# Patient Record
Sex: Male | Born: 1955 | Hispanic: Yes | Smoking: Never smoker
Health system: Southern US, Community
[De-identification: ages and names within clinical notes are randomized; demographics above are authoritative.]

## PROBLEM LIST (undated history)

## (undated) ENCOUNTER — Emergency Department (HOSPITAL_COMMUNITY): Payer: Self-pay | Source: Home / Self Care

---

## 2016-04-28 ENCOUNTER — Emergency Department (HOSPITAL_COMMUNITY): Payer: Self-pay

## 2016-04-28 ENCOUNTER — Emergency Department (HOSPITAL_COMMUNITY)
Admission: EM | Admit: 2016-04-28 | Discharge: 2016-04-28 | Disposition: A | Discharge status: Home / Self Care | Payer: Self-pay | Attending: Emergency Medicine | Admitting: Emergency Medicine

## 2016-04-28 ENCOUNTER — Encounter (HOSPITAL_COMMUNITY): Payer: Self-pay | Admitting: *Deleted

## 2016-04-28 DIAGNOSIS — K5900 Constipation, unspecified: Secondary | ICD-10-CM | POA: Insufficient documentation

## 2016-04-28 DIAGNOSIS — R1084 Generalized abdominal pain: Secondary | ICD-10-CM

## 2016-04-28 LAB — CBC
HEMATOCRIT: 45.6 % (ref 39.0–52.0)
Hemoglobin: 15.8 g/dL (ref 13.0–17.0)
MCH: 31.9 pg (ref 26.0–34.0)
MCHC: 34.6 g/dL (ref 30.0–36.0)
MCV: 92.1 fL (ref 78.0–100.0)
Platelets: 270 10*3/uL (ref 150–400)
RBC: 4.95 MIL/uL (ref 4.22–5.81)
RDW: 13.6 % (ref 11.5–15.5)
WBC: 7.7 10*3/uL (ref 4.0–10.5)

## 2016-04-28 LAB — COMPREHENSIVE METABOLIC PANEL
ALK PHOS: 78 U/L (ref 38–126)
ALT: 21 U/L (ref 17–63)
AST: 20 U/L (ref 15–41)
Albumin: 4.1 g/dL (ref 3.5–5.0)
Anion gap: 8 (ref 5–15)
BUN: 17 mg/dL (ref 6–20)
CHLORIDE: 106 mmol/L (ref 101–111)
CO2: 23 mmol/L (ref 22–32)
Calcium: 8.6 mg/dL — ABNORMAL LOW (ref 8.9–10.3)
Creatinine, Ser: 1.04 mg/dL (ref 0.61–1.24)
GFR calc Af Amer: >60 mL/min (ref >60)
GLUCOSE: 120 mg/dL — AB (ref 65–99)
POTASSIUM: 3.7 mmol/L (ref 3.5–5.1)
SODIUM: 137 mmol/L (ref 135–145)
TOTAL PROTEIN: 6.9 g/dL (ref 6.5–8.1)
Total Bilirubin: 0.7 mg/dL (ref 0.3–1.2)

## 2016-04-28 LAB — URINALYSIS, ROUTINE W REFLEX MICROSCOPIC
BILIRUBIN URINE: NEGATIVE
GLUCOSE, UA: NEGATIVE mg/dL
HGB URINE DIPSTICK: NEGATIVE
KETONES UR: NEGATIVE mg/dL
Leukocytes, UA: NEGATIVE
Nitrite: NEGATIVE
PH: 6 (ref 5.0–8.0)
Protein, ur: NEGATIVE mg/dL
Specific Gravity, Urine: 1.025 (ref 1.005–1.030)

## 2016-04-28 LAB — LIPASE, BLOOD: LIPASE: 17 U/L (ref 11–51)

## 2016-04-28 MED ORDER — DIATRIZOATE MEGLUMINE & SODIUM 66-10 % PO SOLN
ORAL | Status: AC
  Filled 2016-04-28: qty 30

## 2016-04-28 MED ORDER — IOPAMIDOL (ISOVUE-300) INJECTION 61%
100.0000 mL | Freq: Once | INTRAVENOUS | Status: AC | PRN
  Administered 2016-04-28: 100 mL via INTRAVENOUS

## 2016-04-28 MED ORDER — ONDANSETRON HCL 4 MG/2ML IJ SOLN
4.0000 mg | Freq: Once | INTRAMUSCULAR | Status: AC
  Administered 2016-04-28: 4 mg via INTRAVENOUS
  Filled 2016-04-28: qty 2

## 2016-04-28 MED ORDER — FENTANYL CITRATE (PF) 100 MCG/2ML IJ SOLN
50.0000 ug | Freq: Once | INTRAMUSCULAR | Status: AC
Start: 2016-04-28 — End: 2016-04-28
  Administered 2016-04-28: 50 ug via INTRAVENOUS
  Filled 2016-04-28: qty 2

## 2016-04-28 MED ORDER — SODIUM CHLORIDE 0.9 % IV BOLUS (SEPSIS)
1000.0000 mL | Freq: Once | INTRAVENOUS | Status: AC
  Administered 2016-04-28: 1000 mL via INTRAVENOUS

## 2016-04-28 NOTE — ED Provider Notes (Signed)
[facility] DEPT Provider Note   CSN: [id number] Arrival date & time: 04/28/16  0458  Time Seen 06:20 AM   History   Chief Complaint Chief Complaint  Patient presents with  . Abdominal Pain    HPI Adlai Sinning is a 60 y.o. male.  HPI patient states he woke up at 1 AM with 2 spots on his abdomen that are painful. He has had nausea without vomiting, diarrhea or fever.  He has never had this before. He also c/o pain in his bilateral flanks.    PCP None  History reviewed. No pertinent past medical history.  There are no active problems to display for this patient.   History reviewed. No pertinent surgical history.     Home Medications    Prior to Admission medications   Not on File    Family History History reviewed. No pertinent family history.  Social History Social History  Substance Use Topics  . Smoking status: Never Smoker  . Smokeless tobacco: Never Used  . Alcohol use No  employed Drinks beer   Allergies   Review of patient's allergies indicates no known allergies.   Review of Systems Review of Systems  All other systems reviewed and are negative.    Physical Exam Updated Vital Signs BP (!) 158/111 (BP Location: Left Arm)   Pulse (!) 55   Temp 97.5 F (36.4 C) (Oral)   Resp 18   Ht 5\' 2"  (1.575 m)   Wt 162 lb (73.5 kg)   SpO2 97%   BMI 29.63 kg/m   Vital signs normal except hypertension   Physical Exam  Constitutional: He is oriented to person, place, and time. He appears well-developed and well-nourished.  Non-toxic appearance. He does not appear ill. No distress.  HENT:  Head: Normocephalic and atraumatic.  Right Ear: External ear normal.  Left Ear: External ear normal.  Nose: Nose normal. No mucosal edema or rhinorrhea.  Mouth/Throat: Oropharynx is clear and moist and mucous membranes are normal. No dental abscesses or uvula swelling.  Eyes: Conjunctivae and EOM are normal. Pupils are equal, round, and reactive to  light.  Neck: Normal range of motion and full passive range of motion without pain. Neck supple.  Cardiovascular: Normal rate, regular rhythm and normal heart sounds.  Exam reveals no gallop and no friction rub.   No murmur heard. Pulmonary/Chest: Effort normal and breath sounds normal. No respiratory distress. He has no wheezes. He has no rhonchi. He has no rales. He exhibits no tenderness and no crepitus.  Abdominal: Soft. Normal appearance and bowel sounds are normal. He exhibits no distension. There is generalized tenderness. There is no rebound and no guarding.  Musculoskeletal: Normal range of motion. He exhibits no edema or tenderness.  Moves all extremities well.   Neurological: He is alert and oriented to person, place, and time. He has normal strength. No cranial nerve deficit.  Skin: Skin is warm, dry and intact. No rash noted. No erythema. No pallor.  Psychiatric: He has a normal mood and affect. His speech is normal and behavior is normal. His mood appears not anxious.  Nursing note and vitals reviewed.    ED Treatments / Results  Labs (all labs ordered are listed, but only abnormal results are displayed) Results for orders placed or performed during the hospital encounter of 04/28/16  Lipase, blood  Result Value Ref Range   Lipase 17 11 - 51 U/L  Comprehensive metabolic panel  Result Value Ref Range   Sodium 137  135 - 145 mmol/L   Potassium 3.7 3.5 - 5.1 mmol/L   Chloride 106 101 - 111 mmol/L   CO2 23 22 - 32 mmol/L   Glucose, Bld 120 (H) 65 - 99 mg/dL   BUN 17 6 - 20 mg/dL   Creatinine, Ser 4.781.04 0.61 - 1.24 mg/dL   Calcium 8.6 (L) 8.9 - 10.3 mg/dL   Total Protein 6.9 6.5 - 8.1 g/dL   Albumin 4.1 3.5 - 5.0 g/dL   AST 20 15 - 41 U/L   ALT 21 17 - 63 U/L   Alkaline Phosphatase 78 38 - 126 U/L   Total Bilirubin 0.7 0.3 - 1.2 mg/dL   GFR calc non Af Amer >60 >60 mL/min   GFR calc Af Amer >60 >60 mL/min   Anion gap 8 5 - 15  CBC  Result Value Ref Range   WBC 7.7  4.0 - 10.5 K/uL   RBC 4.95 4.22 - 5.81 MIL/uL   Hemoglobin 15.8 13.0 - 17.0 g/dL   HCT 29.545.6 62.139.0 - 30.852.0 %   MCV 92.1 78.0 - 100.0 fL   MCH 31.9 26.0 - 34.0 pg   MCHC 34.6 30.0 - 36.0 g/dL   RDW 65.713.6 84.611.5 - 96.215.5 %   Platelets 270 150 - 400 K/uL  Urinalysis, Routine w reflex microscopic  Result Value Ref Range   Color, Urine YELLOW YELLOW   APPearance CLEAR CLEAR   Specific Gravity, Urine 1.025 1.005 - 1.030   pH 6.0 5.0 - 8.0   Glucose, UA NEGATIVE NEGATIVE mg/dL   Hgb urine dipstick NEGATIVE NEGATIVE   Bilirubin Urine NEGATIVE NEGATIVE   Ketones, ur NEGATIVE NEGATIVE mg/dL   Protein, ur NEGATIVE NEGATIVE mg/dL   Nitrite NEGATIVE NEGATIVE   Leukocytes, UA NEGATIVE NEGATIVE   Laboratory interpretation all normal     EKG  EKG Interpretation None       Radiology Ct Abdomen Pelvis W Contrast  Result Date: 04/28/2016 CLINICAL DATA:  Generalized abdominal pain radiating into the back for several days. EXAM: CT ABDOMEN AND PELVIS WITH CONTRAST TECHNIQUE: Multidetector CT imaging of the abdomen and pelvis was performed using the standard protocol following bolus administration of intravenous contrast. CONTRAST:  100 ml ISOVUE-300 IOPAMIDOL (ISOVUE-300) INJECTION 61% COMPARISON:  None. FINDINGS: Dependent atelectasis is seen lung bases. No pleural or pericardial effusion. A small amount of oral contrast is seen in the distal esophagus suggestive of either poor motility or gastroesophageal reflux. The gallbladder, liver, spleen, adrenal glands, kidneys and pancreas are unremarkable. The prostate gland is somewhat prominent. Urinary bladder and seminal vesicles are unremarkable. The stomach, small and large bowel and appendix appear normal. There is no lymphadenopathy or fluid. Convex right lumbar scoliosis is identified. Small bone island in the right femoral neck is seen. Osseous structures are otherwise unremarkable. Small calcification in the right gluteus maximus may be an injection  granuloma. IMPRESSION: No acute abnormality or finding to explain the patient's symptoms. Mild prostatomegaly. Small amount oral contrast and distal esophagus could be due to esophageal dysmotility or gastroesophageal reflux. Electronically Signed   By: Drusilla Kannerhomas  Dalessio M.D.   On: 04/28/2016 07:13    Procedures Procedures (including critical care time)  Medications Ordered in ED Medications  diatrizoate meglumine-sodium (GASTROGRAFIN) 66-10 % solution (not administered)  fentaNYL (SUBLIMAZE) injection 50 mcg (50 mcg Intravenous Given 04/28/16 0635)  ondansetron (ZOFRAN) injection 4 mg (4 mg Intravenous Given 04/28/16 0635)  sodium chloride 0.9 % bolus 1,000 mL (0 mLs Intravenous Stopped 04/28/16 0800)  iopamidol (ISOVUE-300) 61 % injection 100 mL (100 mLs Intravenous Contrast Given 04/28/16 0648)     Initial Impression / Assessment and Plan / ED Course  I have reviewed the triage vital signs and the nursing notes.  Pertinent labs & imaging results that were available during my care of the patient were reviewed by me and considered in my medical decision making (see chart for details).  Clinical Course   Pt was given IV fluids, IV pain and nausea medications. We discussed getting a CT scan of his abdomen/pelvis and he is agreeable.   I looked at patient CT scan he has a lot of stool in his right colon. I am going to treat him for constipation. Is given his test results.  Final Clinical Impressions(s) / ED Diagnoses   Final diagnoses:  Generalized abdominal pain  Constipation, unspecified constipation type    New Prescriptions OTC miralax  Plan discharge  Devoria AlbeIva Dyneisha Murchison, MD, [facility] PyoFACEP    Leafy Motsinger, MD 04/28/16 [phone]

## 2016-04-28 NOTE — Discharge Instructions (Signed)
Get miralax and put one dose or 17 g in 8 ounces of water,  take 1 dose every 30 minutes for 2-3 hours or until you  get good results and then once or twice daily to prevent constipation.   Obtenga MiraLAX y poner una dosis o 17 g en 240 ml (8 onzas) de agua, tome 1 dosis cada 30 minutos durante 2-3 horas o hasta que [place]obtenga buenos resultados y, a Radio producercontinuacin, una vez o dos veces al da para Multimedia programmerevitar el estreimiento

## 2016-04-28 NOTE — ED Triage Notes (Signed)
Pt c/o generalized abdominal pain that radiates around to bilateral back;

## 2016-06-14 ENCOUNTER — Encounter (HOSPITAL_COMMUNITY): Payer: Self-pay | Admitting: *Deleted

## 2016-06-14 ENCOUNTER — Emergency Department (HOSPITAL_COMMUNITY)
Admission: EM | Admit: 2016-06-14 | Discharge: 2016-06-14 | Disposition: A | Discharge status: Home / Self Care | Payer: Self-pay | Attending: Emergency Medicine | Admitting: Emergency Medicine

## 2016-06-14 DIAGNOSIS — K2900 Acute gastritis without bleeding: Secondary | ICD-10-CM | POA: Insufficient documentation

## 2016-06-14 DIAGNOSIS — R1013 Epigastric pain: Secondary | ICD-10-CM

## 2016-06-14 DIAGNOSIS — E876 Hypokalemia: Secondary | ICD-10-CM | POA: Insufficient documentation

## 2016-06-14 LAB — CBC WITH DIFFERENTIAL/PLATELET
BASOS ABS: 0 10*3/uL (ref 0.0–0.1)
BASOS PCT: 0 %
EOS ABS: 0 10*3/uL (ref 0.0–0.7)
EOS PCT: 0 %
HCT: 47.1 % (ref 39.0–52.0)
Hemoglobin: 16.7 g/dL (ref 13.0–17.0)
LYMPHS PCT: 9 %
Lymphs Abs: 0.9 10*3/uL (ref 0.7–4.0)
MCH: 32.5 pg (ref 26.0–34.0)
MCHC: 35.5 g/dL (ref 30.0–36.0)
MCV: 91.6 fL (ref 78.0–100.0)
MONO ABS: 0.2 10*3/uL (ref 0.1–1.0)
Monocytes Relative: 2 %
Neutro Abs: 10 10*3/uL — ABNORMAL HIGH (ref 1.7–7.7)
Neutrophils Relative %: 89 %
PLATELETS: 334 10*3/uL (ref 150–400)
RBC: 5.14 MIL/uL (ref 4.22–5.81)
RDW: 13.3 % (ref 11.5–15.5)
WBC: 11.1 10*3/uL — ABNORMAL HIGH (ref 4.0–10.5)

## 2016-06-14 LAB — COMPREHENSIVE METABOLIC PANEL
ALT: 22 U/L (ref 17–63)
AST: 25 U/L (ref 15–41)
Albumin: 4.7 g/dL (ref 3.5–5.0)
Alkaline Phosphatase: 76 U/L (ref 38–126)
Anion gap: 12 (ref 5–15)
BUN: 15 mg/dL (ref 6–20)
CHLORIDE: 100 mmol/L — AB (ref 101–111)
CO2: 23 mmol/L (ref 22–32)
Calcium: 8.9 mg/dL (ref 8.9–10.3)
Creatinine, Ser: 1.02 mg/dL (ref 0.61–1.24)
GFR calc Af Amer: >60 mL/min (ref >60)
Glucose, Bld: 132 mg/dL — ABNORMAL HIGH (ref 65–99)
POTASSIUM: 2.7 mmol/L — AB (ref 3.5–5.1)
SODIUM: 135 mmol/L (ref 135–145)
Total Bilirubin: 0.8 mg/dL (ref 0.3–1.2)
Total Protein: 7.8 g/dL (ref 6.5–8.1)

## 2016-06-14 LAB — LIPASE, BLOOD: LIPASE: 18 U/L (ref 11–51)

## 2016-06-14 MED ORDER — SODIUM CHLORIDE 0.9 % IV BOLUS (SEPSIS)
500.0000 mL | Freq: Once | INTRAVENOUS | Status: AC
  Administered 2016-06-14: 500 mL via INTRAVENOUS

## 2016-06-14 MED ORDER — HYDROMORPHONE HCL 1 MG/ML IJ SOLN
1.0000 mg | Freq: Once | INTRAMUSCULAR | Status: AC
  Administered 2016-06-14: 1 mg via INTRAVENOUS
  Filled 2016-06-14: qty 1

## 2016-06-14 MED ORDER — POTASSIUM CHLORIDE CRYS ER 20 MEQ PO TBCR
40.0000 meq | EXTENDED_RELEASE_TABLET | Freq: Once | ORAL | Status: AC
  Administered 2016-06-14: 40 meq via ORAL
  Filled 2016-06-14: qty 2

## 2016-06-14 MED ORDER — SUCRALFATE 1 GM/10ML PO SUSP: 1.0000 g | Freq: Three times a day (TID) | ORAL | 0 refills | Status: AC

## 2016-06-14 MED ORDER — RANITIDINE HCL 150 MG PO TABS: 150.0000 mg | ORAL_TABLET | Freq: Two times a day (BID) | ORAL | 0 refills | Status: AC

## 2016-06-14 MED ORDER — POTASSIUM CHLORIDE CRYS ER 20 MEQ PO TBCR: 20.0000 meq | EXTENDED_RELEASE_TABLET | Freq: Every day | ORAL | 0 refills | Status: AC

## 2016-06-14 MED ORDER — PANTOPRAZOLE SODIUM 40 MG PO TBEC
40.0000 mg | DELAYED_RELEASE_TABLET | Freq: Once | ORAL | Status: AC
  Administered 2016-06-14: 40 mg via ORAL
  Filled 2016-06-14: qty 1

## 2016-06-14 MED ORDER — GI COCKTAIL ~~LOC~~
30.0000 mL | Freq: Once | ORAL | Status: AC
  Administered 2016-06-14: 30 mL via ORAL
  Filled 2016-06-14: qty 30

## 2016-06-14 MED ORDER — ONDANSETRON HCL 4 MG/2ML IJ SOLN
4.0000 mg | Freq: Once | INTRAMUSCULAR | Status: AC
  Administered 2016-06-14: 4 mg via INTRAVENOUS
  Filled 2016-06-14: qty 2

## 2016-06-14 MED ORDER — FAMOTIDINE IN NACL 20-0.9 MG/50ML-% IV SOLN
20.0000 mg | Freq: Once | INTRAVENOUS | Status: AC
  Administered 2016-06-14: 20 mg via INTRAVENOUS
  Filled 2016-06-14: qty 50

## 2016-06-14 NOTE — ED Notes (Signed)
[facility] start [facility] IV, collected blood and walked the blood to the lab at 0418. Labs not ran. Per lab staff they "cannot find blood", "dont see it". Asked for this to send a phelb for collection. Pollina informed.

## 2016-06-14 NOTE — ED Notes (Signed)
CRITICAL VALUE ALERT  Critical value received: K 2.7  Date of notification: 06/14/2016  Time of notification: 0625  Critical value read back:Yes.    Nurse who received alert:  Ran Tullis  Time of first page:  0625  Time MD responded:  [phone]

## 2016-06-14 NOTE — ED Triage Notes (Signed)
Pt c/o abd pain with n/v, radiation to back area that started tonight,

## 2016-06-14 NOTE — ED Provider Notes (Signed)
[facility] Provider Note   CSN: [id number] Arrival date & time: 06/14/16  0353     History   Chief Complaint Chief Complaint  Patient presents with  . Abdominal Pain    HPI Ronald Graves is a 60 y.o. male.  Patient presents to the emergency room with complaints of abdominal pain. Patient complaining of severe upper abdominal pain that radiates into his back. He has had similar symptoms in the past, reports being in ER approximately a month ago with similar symptoms. Patient has not had any diarrhea or constipation. There has not been any fever.      History reviewed. No pertinent past medical history.  There are no active problems to display for this patient.   History reviewed. No pertinent surgical history.     Home Medications    Prior to Admission medications   Medication Sig Start Date End Date Taking? Authorizing Provider  potassium chloride SA (K-DUR,KLOR-CON) 20 MEQ tablet Take 1 tablet (20 mEq total) by mouth daily. 06/14/16   Gilda Creasehristopher J Pollina, MD  ranitidine (ZANTAC) 150 MG tablet Take 1 tablet (150 mg total) by mouth 2 (two) times daily. 06/14/16   Gilda Creasehristopher J Pollina, MD  sucralfate (CARAFATE) 1 GM/10ML suspension Take 10 mLs (1 g total) by mouth 4 (four) times daily -  with meals and at bedtime. 06/14/16   Gilda Creasehristopher J Pollina, MD    Family History No family history on file.  Social History Social History  Substance Use Topics  . Smoking status: Never Smoker  . Smokeless tobacco: Never Used  . Alcohol use No     Allergies   Review of patient's allergies indicates no known allergies.   Review of Systems Review of Systems  Gastrointestinal: Positive for abdominal pain.  All other systems reviewed and are negative.    Physical Exam Updated Vital Signs BP (!) 159/101   Pulse 70   Temp 97.8 F (36.6 C) (Oral)   Resp 18   Ht 5\' 2"  (1.575 m)   Wt 152 lb (68.9 kg)   SpO2 96%   BMI 27.80 kg/m   Physical Exam    Constitutional: He is oriented to person, place, and time. He appears well-developed and well-nourished. He appears distressed.  HENT:  Head: Normocephalic and atraumatic.  Right Ear: Hearing normal.  Left Ear: Hearing normal.  Nose: Nose normal.  Mouth/Throat: Oropharynx is clear and moist and mucous membranes are normal.  Eyes: Conjunctivae and EOM are normal. Pupils are equal, round, and reactive to light.  Neck: Normal range of motion. Neck supple.  Cardiovascular: Regular rhythm, S1 normal and S2 normal.  Exam reveals no gallop and no friction rub.   No murmur heard. Pulmonary/Chest: Effort normal and breath sounds normal. No respiratory distress. He exhibits no tenderness.  Abdominal: Soft. Normal appearance and bowel sounds are normal. There is no hepatosplenomegaly. There is tenderness in the epigastric area. There is no rebound, no guarding, no tenderness at McBurney's point and negative Murphy's sign. No hernia.  Musculoskeletal: Normal range of motion.  Neurological: He is alert and oriented to person, place, and time. He has normal strength. No cranial nerve deficit or sensory deficit. Coordination normal. GCS eye subscore is 4. GCS verbal subscore is 5. GCS motor subscore is 6.  Skin: Skin is warm, dry and intact. No rash noted. No cyanosis.  Psychiatric: He has a normal mood and affect. His speech is normal and behavior is normal. Thought content normal.  Nursing note and vitals  reviewed.    ED Treatments / Results  Labs (all labs ordered are listed, but only abnormal results are displayed) Labs Reviewed  CBC WITH DIFFERENTIAL/PLATELET - Abnormal; Notable for the following:       Result Value   WBC 11.1 (*)    Neutro Abs 10.0 (*)    All other components within normal limits  COMPREHENSIVE METABOLIC PANEL - Abnormal; Notable for the following:    Potassium 2.7 (*)    Chloride 100 (*)    Glucose, Bld 132 (*)    All other components within normal limits  LIPASE, BLOOD     EKG  EKG Interpretation  Date/Time:  Sunday June 14 2016 04:23:16 EDT Ventricular Rate:  73 PR Interval:    QRS Duration: 102 QT Interval:  419 QTC Calculation: 462 R Axis:   52 Text Interpretation:  Ectopic atrial rhythm Borderline short PR interval Abnormal R-wave progression, early transition Borderline T wave abnormalities No previous tracing Confirmed by POLLINA  MD, CHRISTOPHER ([id number]) on 06/14/2016 4:26:36 AM       Radiology No results found.  Procedures Procedures (including critical care time)  Medications Ordered in ED Medications  potassium chloride SA (K-DUR,KLOR-CON) CR tablet 40 mEq (not administered)  sodium chloride 0.9 % bolus 500 mL (500 mLs Intravenous New Bag/Given 06/14/16 0432)  ondansetron (ZOFRAN) injection 4 mg (4 mg Intravenous Given 06/14/16 0427)  gi cocktail (Maalox,Lidocaine,Donnatal) (30 mLs Oral Given 06/14/16 0424)  HYDROmorphone (DILAUDID) injection 1 mg (1 mg Intravenous Given 06/14/16 0427)  pantoprazole (PROTONIX) EC tablet 40 mg (40 mg Oral Given 06/14/16 0429)  famotidine (PEPCID) IVPB 20 mg premix (0 mg Intravenous Stopped 06/14/16 0543)     Initial Impression / Assessment and Plan / ED Course  I have reviewed the triage vital signs and the nursing notes.  Pertinent labs & imaging results that were available during my care of the patient were reviewed by me and considered in my medical decision making (see chart for details).  Clinical Course   Patient was in the ER previously with similar symptoms. He had a thorough workup at that time including CAT scan that did not show any acute abnormality. Patient complaining of epigastric pain today. His exam did reveal tenderness in epigastric region, no Murphy sign. He did not have gallbladder disease on previous scan. Patient's lab work is normal other than low potassium today. He was given supplementation. Patient has had resolution of pain with IV Dilaudid, Protonix and Pepcid as well as GI  cocktail. Will treat with empiric antacid in follow-up with GI.  Final Clinical Impressions(s) / ED Diagnoses   Final diagnoses:  Epigastric pain  Acute gastritis without hemorrhage, unspecified gastritis type  Hypokalemia    New Prescriptions New Prescriptions   POTASSIUM CHLORIDE SA (K-DUR,KLOR-CON) 20 MEQ TABLET    Take 1 tablet (20 mEq total) by mouth daily.   RANITIDINE (ZANTAC) 150 MG TABLET    Take 1 tablet (150 mg total) by mouth 2 (two) times daily.   SUCRALFATE (CARAFATE) 1 GM/10ML SUSPENSION    Take 10 mLs (1 g total) by mouth 4 (four) times daily -  with meals and at bedtime.     Gilda Crease, MD 06/14/16 [phone]

## 2017-06-05 IMAGING — CT CT ABD-PELV W/ CM
2 of 5 series · 16 of 46 positions shown, 18 images · IV contrast (iopamidol)
Comparison: None.

CLINICAL DATA: Generalized abdominal pain radiating into the back
for several days.

EXAM:
CT ABDOMEN AND PELVIS WITH CONTRAST
TECHNIQUE: Multidetector CT imaging of the abdomen and pelvis was performed
using the standard protocol following bolus administration of
intravenous contrast.
CONTRAST:  100 ml [id number] IOPAMIDOL ([id number]-BHH) INJECTION 61%

[Series 2: routine abd pel with · axial · 0.72mm/px · z∈[-450,-20]mm · 13 of 98 slices shown, 15 images]
[im 6/98  soft-tissue]
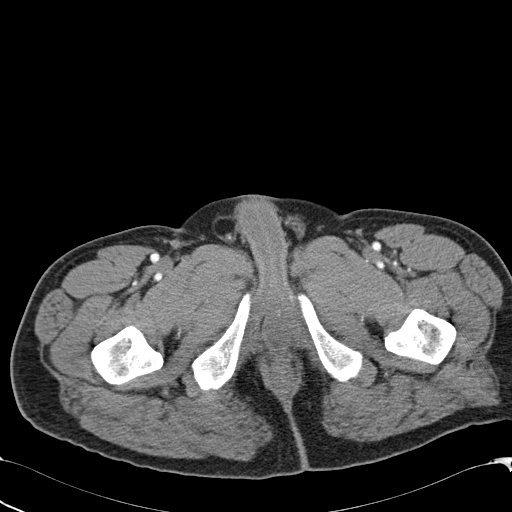
[im 6/98  bone]
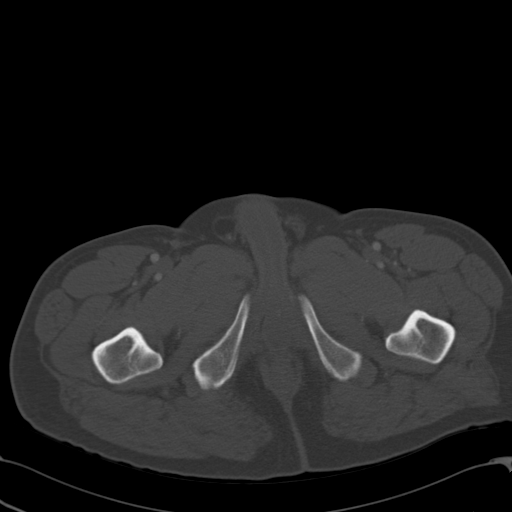
[im 12/98  soft-tissue]
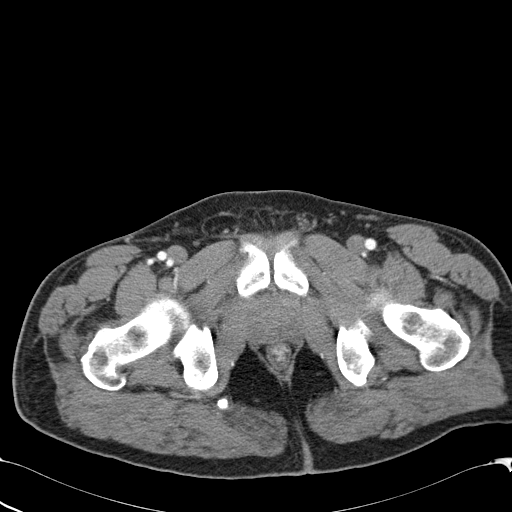
[im 23/98  soft-tissue]
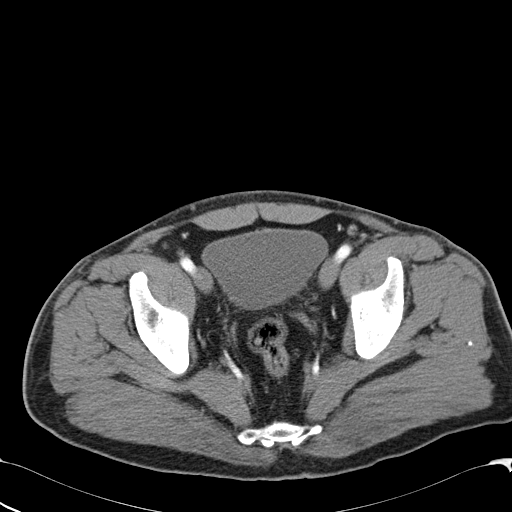
[im 29/98  soft-tissue]
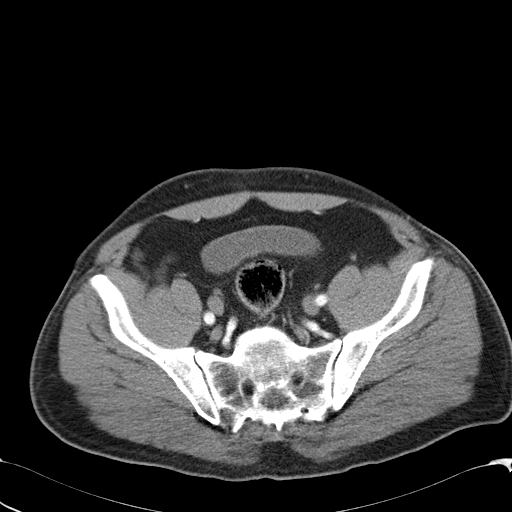
[im 35/98  soft-tissue]
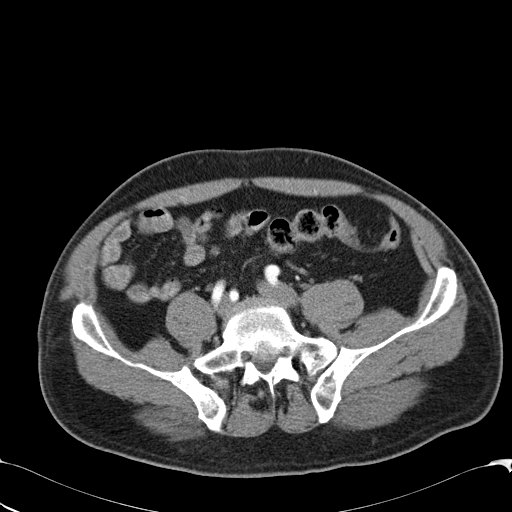
[im 40/98  soft-tissue]
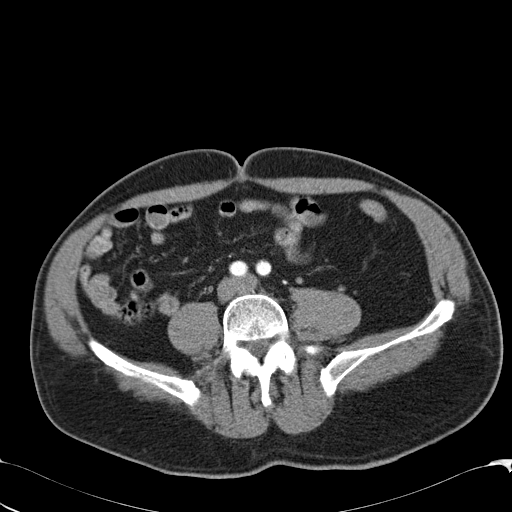
[im 52/98  soft-tissue]
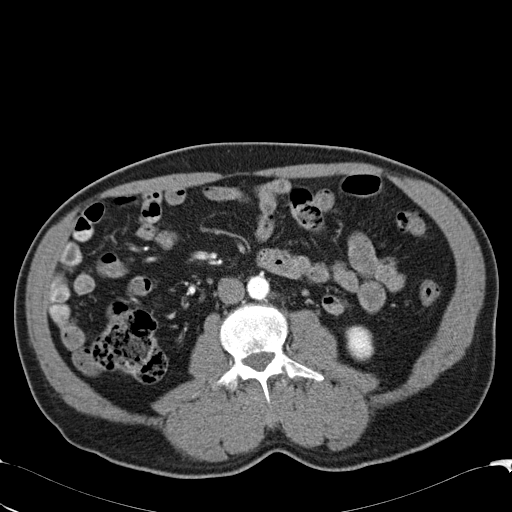
[im 58/98  soft-tissue]
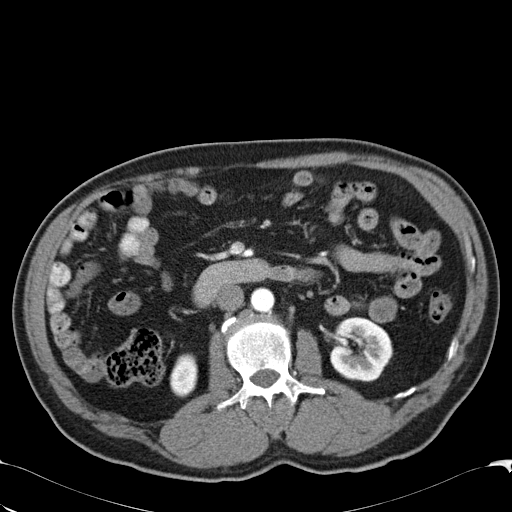
[im 63/98  soft-tissue]
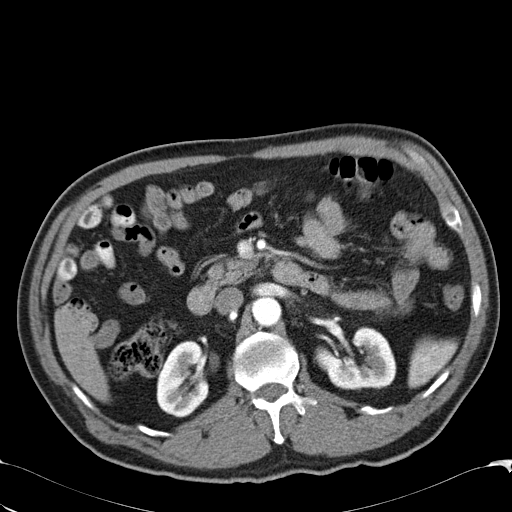
[im 63/98  bone]
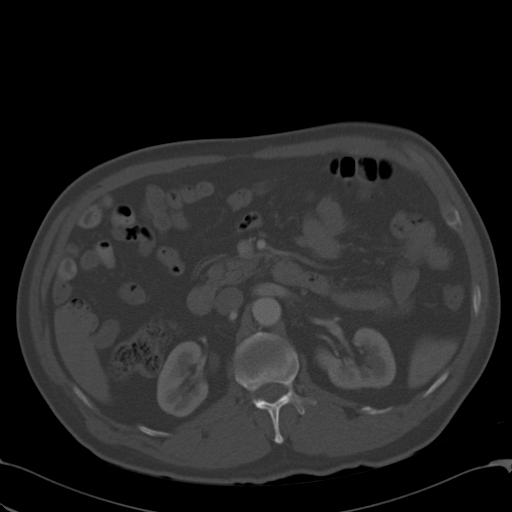
[im 69/98  soft-tissue]
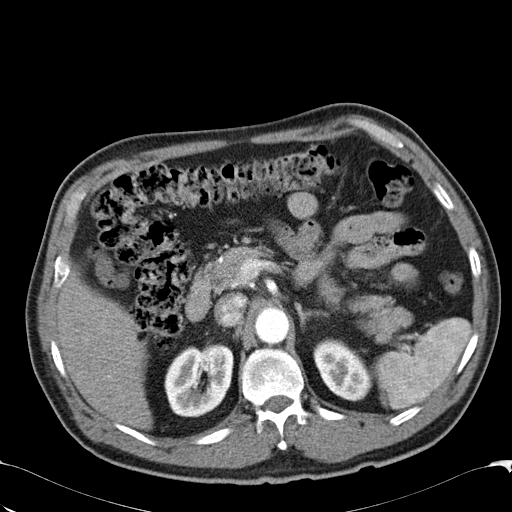
[im 75/98  soft-tissue]
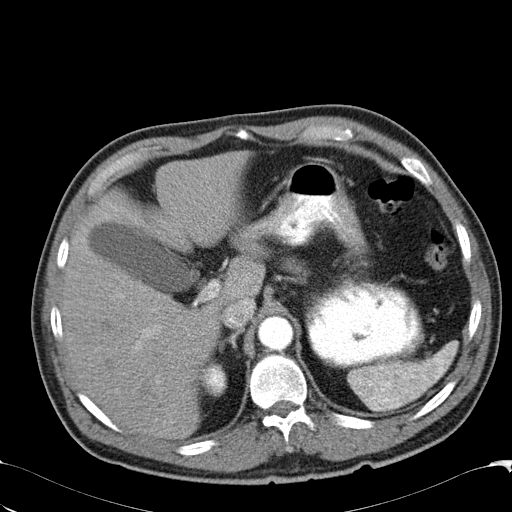
[im 86/98  soft-tissue]
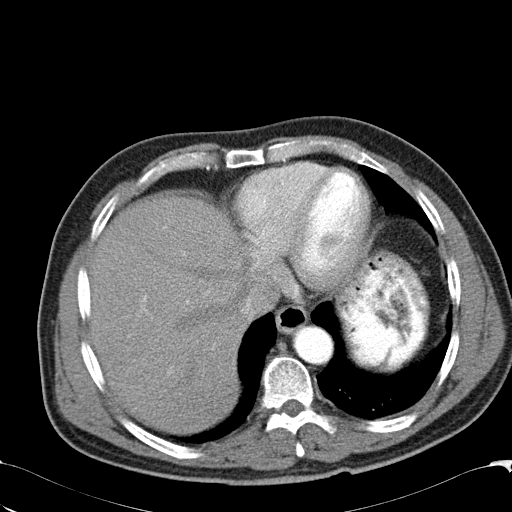
[im 92/98  soft-tissue]
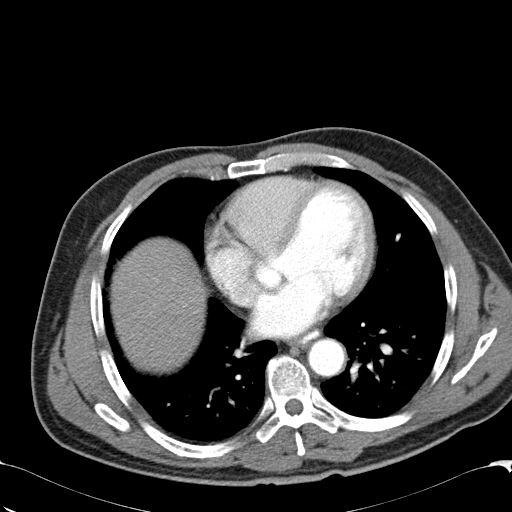

[Series 3: coronal · coronal · 0.69mm/px · 3 of 116 slices shown]
[im 39/116  soft-tissue]
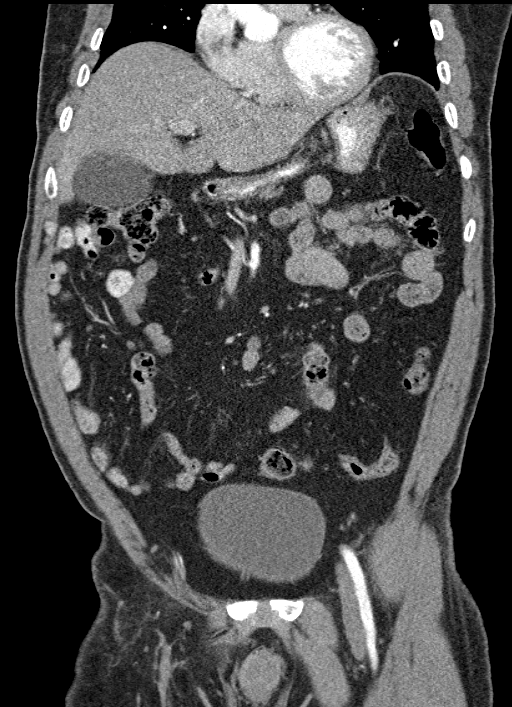
[im 52/116  soft-tissue]
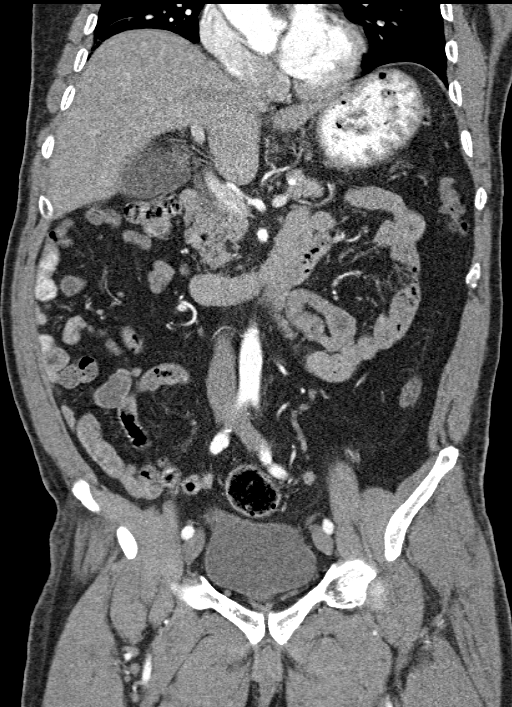
[im 64/116  soft-tissue]
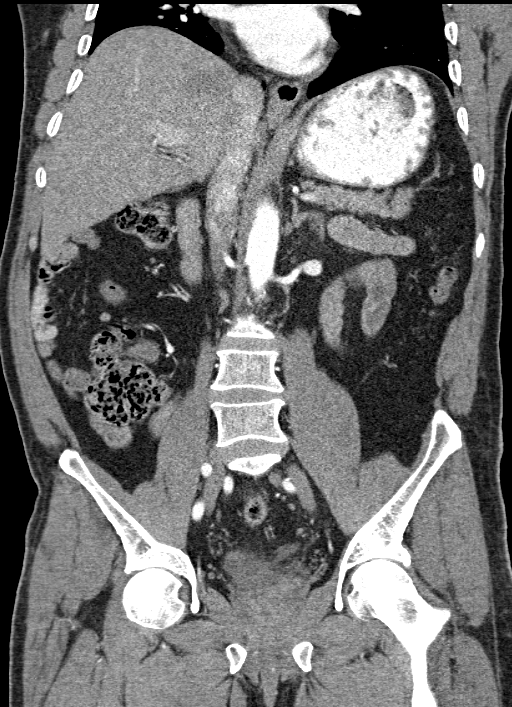

[16 of 46 positions shown; findings below may reference images not displayed]

FINDINGS: Dependent atelectasis is seen lung bases. No pleural or pericardial
effusion. A small amount of oral contrast is seen in the distal
esophagus suggestive of either poor motility or gastroesophageal
reflux.

The gallbladder, liver, spleen, adrenal glands, kidneys and pancreas
are unremarkable.

The prostate gland is somewhat prominent. Urinary bladder and
seminal vesicles are unremarkable. The stomach, small and large
bowel and appendix appear normal. There is no lymphadenopathy or
fluid.

Convex right lumbar scoliosis is identified. Small bone island in
the right femoral neck is seen. Osseous structures are otherwise
unremarkable. Small calcification in the right gluteus maximus may
be an injection granuloma.
IMPRESSION: No acute abnormality or finding to explain the patient's symptoms.

Mild prostatomegaly.

Small amount oral contrast and distal esophagus could be due to
esophageal dysmotility or gastroesophageal reflux.
